# Patient Record
Sex: Female | Born: 1960 | Race: Black or African American | Hispanic: No | Marital: Single
Health system: Southern US, Community
[De-identification: ages and names within clinical notes are randomized; demographics above are authoritative.]

---

## 2004-10-04 ENCOUNTER — Ambulatory Visit: Payer: Self-pay | Admitting: Internal Medicine

## 2006-04-21 ENCOUNTER — Ambulatory Visit (HOSPITAL_COMMUNITY): Admission: RE | Admit: 2006-04-21 | Discharge: 2006-04-21 | Payer: Self-pay | Admitting: Obstetrics & Gynecology

## 2007-11-21 ENCOUNTER — Encounter (INDEPENDENT_AMBULATORY_CARE_PROVIDER_SITE_OTHER): Payer: Self-pay | Admitting: Internal Medicine

## 2007-12-03 ENCOUNTER — Ambulatory Visit (HOSPITAL_COMMUNITY): Admission: RE | Admit: 2007-12-03 | Discharge: 2007-12-03 | Payer: Self-pay | Admitting: Obstetrics & Gynecology

## 2007-12-06 DIAGNOSIS — I10 Essential (primary) hypertension: Secondary | ICD-10-CM

## 2008-10-15 ENCOUNTER — Ambulatory Visit: Payer: Self-pay | Admitting: Nurse Practitioner

## 2008-10-15 DIAGNOSIS — E7800 Pure hypercholesterolemia, unspecified: Secondary | ICD-10-CM

## 2008-10-15 DIAGNOSIS — M6740 Ganglion, unspecified site: Secondary | ICD-10-CM | POA: Insufficient documentation

## 2008-10-15 DIAGNOSIS — F17200 Nicotine dependence, unspecified, uncomplicated: Secondary | ICD-10-CM

## 2008-11-21 ENCOUNTER — Encounter (INDEPENDENT_AMBULATORY_CARE_PROVIDER_SITE_OTHER): Payer: Self-pay | Admitting: Nurse Practitioner

## 2008-11-21 ENCOUNTER — Ambulatory Visit: Payer: Self-pay | Admitting: Nurse Practitioner

## 2008-11-21 LAB — CONVERTED CEMR LAB

## 2008-11-26 ENCOUNTER — Encounter (INDEPENDENT_AMBULATORY_CARE_PROVIDER_SITE_OTHER): Payer: Self-pay | Admitting: Nurse Practitioner

## 2008-12-08 ENCOUNTER — Ambulatory Visit (HOSPITAL_COMMUNITY): Admission: RE | Admit: 2008-12-08 | Discharge: 2008-12-08 | Payer: Self-pay | Admitting: Internal Medicine

## 2009-02-19 ENCOUNTER — Encounter (INDEPENDENT_AMBULATORY_CARE_PROVIDER_SITE_OTHER): Payer: Self-pay | Admitting: Nurse Practitioner

## 2009-02-20 ENCOUNTER — Ambulatory Visit: Payer: Self-pay | Admitting: Nurse Practitioner

## 2009-02-26 ENCOUNTER — Ambulatory Visit: Payer: Self-pay | Admitting: Nurse Practitioner

## 2009-02-27 ENCOUNTER — Encounter (INDEPENDENT_AMBULATORY_CARE_PROVIDER_SITE_OTHER): Payer: Self-pay | Admitting: Nurse Practitioner

## 2009-02-27 LAB — CONVERTED CEMR LAB
AST: 28 units/L (ref 0–37)
Albumin: 4.5 g/dL (ref 3.5–5.2)
Alkaline Phosphatase: 83 units/L (ref 39–117)
Bilirubin, Direct: 0.1 mg/dL (ref 0.0–0.3)
Cholesterol: 165 mg/dL (ref 0–200)
Indirect Bilirubin: 0.4 mg/dL (ref 0.0–0.9)

## 2009-03-12 ENCOUNTER — Telehealth (INDEPENDENT_AMBULATORY_CARE_PROVIDER_SITE_OTHER): Payer: Self-pay | Admitting: Nurse Practitioner

## 2009-06-05 ENCOUNTER — Ambulatory Visit: Payer: Self-pay | Admitting: Nurse Practitioner

## 2009-06-10 ENCOUNTER — Encounter (INDEPENDENT_AMBULATORY_CARE_PROVIDER_SITE_OTHER): Payer: Self-pay | Admitting: Nurse Practitioner

## 2009-06-18 ENCOUNTER — Telehealth (INDEPENDENT_AMBULATORY_CARE_PROVIDER_SITE_OTHER): Payer: Self-pay | Admitting: Nurse Practitioner

## 2009-07-13 ENCOUNTER — Telehealth (INDEPENDENT_AMBULATORY_CARE_PROVIDER_SITE_OTHER): Payer: Self-pay | Admitting: Nurse Practitioner

## 2009-08-06 ENCOUNTER — Ambulatory Visit: Payer: Self-pay | Admitting: Nurse Practitioner

## 2009-08-06 DIAGNOSIS — M25569 Pain in unspecified knee: Secondary | ICD-10-CM | POA: Insufficient documentation

## 2009-08-06 LAB — CONVERTED CEMR LAB
Glucose, Urine, Semiquant: NEGATIVE
Microalb, Ur: 0.5 mg/dL (ref 0.00–1.89)
Nitrite: NEGATIVE
Specific Gravity, Urine: 1.025
WBC Urine, dipstick: NEGATIVE
pH: 5

## 2009-12-24 ENCOUNTER — Ambulatory Visit: Payer: Self-pay | Admitting: Nurse Practitioner

## 2009-12-24 DIAGNOSIS — J309 Allergic rhinitis, unspecified: Secondary | ICD-10-CM | POA: Insufficient documentation

## 2009-12-28 ENCOUNTER — Encounter (INDEPENDENT_AMBULATORY_CARE_PROVIDER_SITE_OTHER): Payer: Self-pay | Admitting: Nurse Practitioner

## 2009-12-29 ENCOUNTER — Ambulatory Visit (HOSPITAL_COMMUNITY): Admission: RE | Admit: 2009-12-29 | Discharge: 2009-12-29 | Payer: Self-pay | Admitting: Nurse Practitioner

## 2010-01-07 ENCOUNTER — Encounter (INDEPENDENT_AMBULATORY_CARE_PROVIDER_SITE_OTHER): Payer: Self-pay | Admitting: Nurse Practitioner

## 2010-01-14 ENCOUNTER — Telehealth (INDEPENDENT_AMBULATORY_CARE_PROVIDER_SITE_OTHER): Payer: Self-pay | Admitting: Nurse Practitioner

## 2010-06-17 ENCOUNTER — Ambulatory Visit: Payer: Self-pay | Admitting: Nurse Practitioner

## 2010-06-17 LAB — CONVERTED CEMR LAB: Sed Rate: 11 mm/hr (ref 0–22)

## 2010-08-31 ENCOUNTER — Telehealth (INDEPENDENT_AMBULATORY_CARE_PROVIDER_SITE_OTHER): Payer: Self-pay | Admitting: Nurse Practitioner

## 2010-11-10 ENCOUNTER — Telehealth (INDEPENDENT_AMBULATORY_CARE_PROVIDER_SITE_OTHER): Payer: Self-pay | Admitting: Nurse Practitioner

## 2011-01-02 LAB — CONVERTED CEMR LAB
ALT: 27 units/L (ref 0–35)
AST: 25 units/L (ref 0–37)
AST: 26 units/L (ref 0–37)
Albumin: 4.8 g/dL (ref 3.5–5.2)
Alkaline Phosphatase: 81 units/L (ref 39–117)
BUN: 11 mg/dL (ref 6–23)
Basophils Absolute: 0 10*3/uL (ref 0.0–0.1)
Basophils Relative: 1 % (ref 0–1)
Basophils Relative: 2 % — ABNORMAL HIGH (ref 0–1)
CO2: 24 meq/L (ref 19–32)
Calcium: 10.1 mg/dL (ref 8.4–10.5)
Calcium: 9.3 mg/dL (ref 8.4–10.5)
Chlamydia, DNA Probe: NEGATIVE
Chlamydia, DNA Probe: NEGATIVE
Chloride: 102 meq/L (ref 96–112)
Cholesterol, target level: 200 mg/dL
Cholesterol: 215 mg/dL — ABNORMAL HIGH (ref 0–200)
Creatinine, Ser: 0.67 mg/dL (ref 0.40–1.20)
GC Probe Amp, Genital: NEGATIVE
Glucose, Bld: 94 mg/dL (ref 70–99)
Glucose, Urine, Semiquant: NEGATIVE
Glucose, Urine, Semiquant: NEGATIVE
HCT: 40.8 % (ref 36.0–46.0)
HDL: 47 mg/dL (ref >39)
Hemoglobin: 12.4 g/dL (ref 12.0–15.0)
Hemoglobin: 13.5 g/dL (ref 12.0–15.0)
KOH Prep: NEGATIVE
KOH Prep: NEGATIVE
Ketones, urine, test strip: NEGATIVE
LDL Cholesterol: 92 mg/dL (ref 0–99)
LDL Goal: 130 mg/dL
Lymphocytes Relative: 43 % (ref 12–46)
Lymphs Abs: 2.2 10*3/uL (ref 0.7–4.0)
Lymphs Abs: 2.4 10*3/uL (ref 0.7–4.0)
MCHC: 31.2 g/dL (ref 30.0–36.0)
Monocytes Absolute: 0.5 10*3/uL (ref 0.1–1.0)
Monocytes Absolute: 0.6 10*3/uL (ref 0.1–1.0)
Monocytes Relative: 11 % (ref 3–12)
Neutro Abs: 1 10*3/uL — ABNORMAL LOW (ref 1.7–7.7)
Neutro Abs: 2.3 10*3/uL (ref 1.7–7.7)
Nitrite: NEGATIVE
OCCULT 1: NEGATIVE
Potassium: 4.7 meq/L (ref 3.5–5.3)
RBC: 4.6 M/uL (ref 3.87–5.11)
RBC: 4.79 M/uL (ref 3.87–5.11)
RDW: 14.3 % (ref 11.5–15.5)
Sodium: 141 meq/L (ref 135–145)
Specific Gravity, Urine: 1.015
TSH: 1.333 microintl units/mL (ref 0.350–4.50)
TSH: 1.415 microintl units/mL (ref 0.350–4.500)
Total CHOL/HDL Ratio: 3.5
Triglycerides: 120 mg/dL (ref <150)
Urobilinogen, UA: 0.2
WBC: 4.1 10*3/uL (ref 4.0–10.5)
WBC: 5.6 10*3/uL (ref 4.0–10.5)
pH: 6

## 2011-01-04 NOTE — Progress Notes (Signed)
Summary: Query:  Refill loratidine?  Phone Note Outgoing Call   Summary of Call: Do you want her loratidine refilled?  Rx 06/2009 x3 refills.   Initial call taken by: Dutch Quint RN,  August 31, 2010 9:48 AM  Follow-up for Phone Call        yes, ok to refill Follow-up by: Lehman Prom FNP,  August 31, 2010 10:14 AM  Additional Follow-up for Phone Call Additional follow up Details #1::        Rx refilled.  Dutch Quint RN  August 31, 2010 10:19 AM

## 2011-01-04 NOTE — Letter (Signed)
Summary: *HSN Results Follow up  [facility]  [address] [place], [place] [address]   Phone: [phone]  Fax: [phone]      12/28/2009   Sandy Perry [address] [place], [place]  [address]   Dear  Ms. Sandy Perry,                            ____S.Drinkard,FNP   ____D. Gore,FNP       ____B. McPherson,MD   ____V. Rankins,MD    ____E. Mulberry,MD    _X___N. Daphine Deutscher, FNP  ____D. Reche Dixon, MD    ____K. Philipp Deputy, MD    ____Other     This letter is to inform you that your recent test(s):  ___X____Pap Smear    ___X____Lab Test     _______X-ray    ____X___ is within acceptable limits  _______ requires a medication change  _______ requires a follow-up lab visit  _______ requires a follow-up visit with your provider   Comments: Labs done during recent office visit are ok.  Pap Smear is ________________________________.       _________________________________________________________ If you have any questions, please contact our office [phone].                    Sincerely,    Sandy Prom FNP [facility]

## 2011-01-04 NOTE — Letter (Signed)
Summary: *HSN Results Follow up  [facility]  [address] [place], [place] [address]   Phone: [phone]  Fax: [phone]      01/07/2010   [name] [address] [place], [place]  [address]   Dear  Ms. Sandy Perry,                            ____S.Drinkard,FNP   ____D. Gore,FNP       ____B. McPherson,MD   ____V. Rankins,MD    ____E. Mulberry,MD    _X___N. Daphine Deutscher, FNP  ____D. Reche Dixon, MD    ____K. Philipp Deputy, MD    ____Other     This letter is to inform you that your recent test(s):  Stool Cards  ____X___ is within acceptable limits  _______ requires a medication change  _______ requires a follow-up lab visit  _______ requires a follow-up visit with your provider   Comments:  Stool cards are normal.       _________________________________________________________ If you have any questions, please contact our office [phone].                    Sincerely,    Sandy Prom FNP [facility]

## 2011-01-04 NOTE — Progress Notes (Signed)
Summary: Office Visit/DEPRESSION SCREENING  Office Visit/DEPRESSION SCREENING   Imported By: Arta Bruce 01/29/2010 14:52:01  _____________________________________________________________________  External Attachment:    Type:   Image     Comment:   External Document

## 2011-01-04 NOTE — Assessment & Plan Note (Signed)
Summary: Left leg pain   Vital Signs:  Patient profile:   50 year old female Menstrual status:  postmenopausal Weight:      167.6 pounds BMI:     29.80 BSA:     1.80 Temp:     98.0 degrees F oral Pulse rate:   69 / minute Pulse rhythm:   regular Resp:     16 per minute BP sitting:   112 / 81  (left arm) Cuff size:   regular  Vitals Entered By: Levon Hedger (June 17, 2010 8:59 AM) CC: knee pain with swelling x 1 month, Hypertension Management, Lipid Management Is Patient Diabetic? No Pain Assessment Patient in pain? yes     Location: knee  Does patient need assistance? Functional Status Self care Ambulation Normal   CC:  knee pain with swelling x 1 month, Hypertension Management, and Lipid Management.  History of Present Illness:  Pt into the office with complaints of left leg pain Reports that at least 2 weeks ago she woke with extreme swelling in her left leg. Painful to walk, pt admitted that she had to hop when walking. 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  Hypertension History:      She denies headache, chest pain, and palpitations.  She notes no problems with any antihypertensive medication side effects.        Positive major cardiovascular risk factors include hyperlipidemia and hypertension.  Negative major cardiovascular risk factors include female age less than 43 years old, no history of diabetes, negative family history for ischemic heart disease, and non-tobacco-user status.        Further assessment for target organ damage reveals no history of ASHD, cardiac end-organ  damage (CHF/LVH), stroke/TIA, peripheral vascular disease, renal insufficiency, or hypertensive retinopathy.    Lipid Management History:      Positive NCEP/ATP III risk factors include hypertension.  Negative NCEP/ATP III risk factors include female age less than 33 years old, non-diabetic, no family history for ischemic heart disease, non-tobacco-user status, no ASHD (atherosclerotic heart disease), no prior stroke/TIA, no peripheral vascular disease, and no history of aortic aneurysm.        The patient states that she knows about the "Therapeutic Lifestyle Change" diet.  Her compliance with the TLC diet is fair.  The patient does not know about adjunctive measures for cholesterol lowering.  She expresses no side effects from her lipid-lowering medication.  Comments include: pt is taking meds as ordered.  The patient denies any symptoms to suggest myopathy or liver disease.      Medications Prior to Update: 1)  Hydrochlorothiazide 25 Mg Tabs (Hydrochlorothiazide) .... Take One (1) By Mouth Daily For Blood Pressure 2)  Pravastatin Sodium 20 Mg Tabs (Pravastatin Sodium) .Marland Kitchen.. 1 Tablet By Mouth Nightly For Cholesterol 3)  Loratadine 10 Mg Tabs (Loratadine) .Marland Kitchen.. 1 Tablet By Mouth Daily For Allergies 4)  Fluticasone Propionate 50 Mcg/act Susp (Fluticasone Propionate) .Marland Kitchen.. 1 Puff in Each Nostril Two Times A Day  **hold Head Down**  Allergies (verified): No Known Drug Allergies  Review of Systems CV:  Complains  of leg cramps with exertion; denies chest pain or discomfort and shortness of breath with exertion. Resp:  Denies cough. GI:  Denies abdominal pain, nausea, and vomiting. MS:  Complains of joint pain and muscle; left leg pain.  Physical Exam  General:  alert.   Head:  normocephalic.   Eyes:  glasses Lungs:  normal breath sounds.   Heart:  normal rate and regular rhythm.   Msk:  up to the exam table Pulses:  R dorsalis pedis normal and L dorsalis pedis normal.   Neurologic:  alert &  oriented X3.   limping gait   Impression & Recommendations:  Problem # 1:  PAIN IN JOINT, LOWER LEG (ICD-719.46) Will check labs today  if elevated will need ultrasound for left leg to check for DVT if negative pt instructed to get knee support and start anti-inflammatory Her updated medication list for this problem includes:    Diclofenac Sodium 75 Mg Tbec (Diclofenac sodium) ..... One tablet by mouth two times a day as needed for inflammation  Orders: T-Sed Rate (Automated) (04540-98119) T-D-Dimer Fibrin Derivatives Quantitive [phone])  Problem # 2:  HYPERTENSION, BENIGN ESSENTIAL (ICD-401.1) BP stable continue current meds Her updated medication list for this problem includes:    Hydrochlorothiazide 25 Mg Tabs (Hydrochlorothiazide) .Marland Kitchen... Take one (1) by mouth daily for blood pressure  Problem # 3:  HYPERCHOLESTEROLEMIA (ICD-272.0)  Her updated medication list for this problem includes:    Pravastatin Sodium 20 Mg Tabs (Pravastatin sodium) .Marland Kitchen... 1 tablet by mouth nightly for cholesterol  Complete Medication List: 1)  Hydrochlorothiazide 25 Mg Tabs (Hydrochlorothiazide) .... Take one (1) by mouth daily for blood pressure 2)  Pravastatin Sodium 20 Mg Tabs (Pravastatin sodium) .Marland Kitchen.. 1 tablet by mouth nightly for cholesterol 3)  Loratadine 10 Mg Tabs (Loratadine) .Marland Kitchen.. 1 tablet by mouth daily for allergies 4)  Fluticasone Propionate 50 Mcg/act Susp (Fluticasone propionate) .Marland Kitchen.. 1 puff in each nostril two times a day  **hold head down** 5)  Diclofenac Sodium 75 Mg Tbec (Diclofenac sodium) .... One tablet by mouth two times a day as needed for inflammation  Hypertension Assessment/Plan:      The patient's hypertensive risk group is category B: At least one risk factor (excluding diabetes) with no target organ damage.  Her calculated 10 year risk of coronary heart disease is 6 %.  Today's blood pressure is 112/81.  Her blood pressure goal is < 140/90.  Lipid Assessment/Plan:       Based on NCEP/ATP III, the patient's risk factor category is "0-1 risk factors".  The patient's lipid goals are as follows: Total cholesterol goal is 200; LDL cholesterol goal is 130; HDL cholesterol goal is 40; Triglyceride goal is 150.    Patient Instructions: 1)  Your labs will be sent to the lab and results will be returned today. 2)  If labs are ok, then you will need to get a left knee support and start diclofenac 75mg  by mouth two times a day (with food) for at least 7 days then as needed  3)  If labs are positive then you will need to get an ultrasoud of the lower extremities to r/o blood clot Prescriptions: DICLOFENAC SODIUM 75 MG TBEC (DICLOFENAC SODIUM) One tablet by mouth two times a day as needed for inflammation  #60 x 0   Entered and Authorized by:   Lehman Prom FNP   Signed by:   Lehman Prom FNP on 06/17/2010   Method used:   Print then Give  to Patient   RxID:   [id number]

## 2011-01-04 NOTE — Assessment & Plan Note (Signed)
Summary: Complete Physical Exam   Vital Signs:  Patient profile:   50 year old female Menstrual status:  postmenopausal Weight:      171 pounds Temp:     98.2 degrees F Pulse rate:   67 / minute Pulse rhythm:   regular Resp:     20 per minute BP sitting:   121 / 83  (left arm) Cuff size:   regular  Vitals Entered By: Vesta Mixer CMA (December 24, 2009 8:42 AM) CC: CPP also has cold or something, Hypertension Management Is Patient Diabetic? No Pain Assessment Patient in pain? no       Does patient need assistance? Ambulation Normal Menarche (age onset years): 8    Menstrual Status postmenopausal Last PAP Result  Specimen Adequacy: Satisfactory for evaluation.   Interpretation/Result:Negative for intraepithelial Lesion or Malignancy.      CC:  CPP also has cold or something and Hypertension Management.  History of Present Illness:  Pt into the office for a complete physical exam.  PAP - All previous PAP Smears have been normal. no family hx of ovarian cancer Menses - usually has spotting about 1 time per year that has been going on since at least age 65 Relationship: has a boyfriend.  s/p tubal ligation  Mammogram - done last year. No family hx of breast cancer  Tdap - up to date  Optho - last visit 2007. wears glasses  Dental - last visit in 08/2008  Colonscopy - never had a colonscopy.  no family history of colon cancer no BRBPR normal bowel movements. no constipation  Pt is fasting today for labs   Hypertension History:      She denies headache, chest pain, and palpitations.  She notes no problems with any antihypertensive medication side effects.  taking meds as ordered.        Positive major cardiovascular risk factors include hyperlipidemia and hypertension.  Negative major cardiovascular risk factors include female age less than 7 years old, negative family history for ischemic heart disease, and non-tobacco-user status.        Further assessment  for target organ damage reveals no history of ASHD, cardiac end-organ damage (CHF/LVH), stroke/TIA, peripheral vascular disease, renal insufficiency, or hypertensive retinopathy.     Habits & Providers  Alcohol-Tobacco-Diet     Alcohol type: beer     Tobacco Status: quit     Tobacco Counseling: to remain off tobacco products     Cigarette Packs/Day: 1/2     Year Started: age 27  Exercise-Depression-Behavior     Does Patient Exercise: yes     Type of exercise: walking     Have you felt down or hopeless? no     Have you felt little pleasure in things? no     Depression Counseling: not indicated; screening negative for depression     Drug Use: never     Seat Belt Use: 100     Sun Exposure: occasionally  Comments: PHQ-9 score = 6  Allergies: No Known Drug Allergies  Review of Systems General:  Denies fever. Eyes:  Denies blurring. ENT:  Complains of nasal congestion; denies earache. CV:  Denies chest pain or discomfort. Resp:  Denies cough. GI:  Denies abdominal pain. GU:  Denies discharge. MS:  Denies joint pain. Derm:  Denies rash. Neuro:  Denies headaches. Psych:  Denies anxiety and depression.  Physical Exam  General:  alert.   Head:  normocephalic.   Eyes:  vision grossly intact, pupils equal, and  pupils round.  glasses Ears:  Bil TM with clear fluid, no erythema Nose:  inflammed turbinates Mouth:  pharynx pink and moist and poor dentition.   Neck:  supple.   Chest Wall:  no mass.   Breasts:  right breast - lump noted at 6 o'clock left breast - no tenderness Lungs:  normal breath sounds.   Heart:  normal rate and regular rhythm.   Abdomen:  soft, non-tender, and normal bowel sounds.   Rectal:  no external abnormalities.   Msk:  normal ROM.   Pulses:  R radial normal, R dorsalis pedis normal, L radial normal, and L dorsalis pedis normal.   Extremities:  no edema Neurologic:  alert & oriented X3, cranial nerves II-XII intact, and gait normal.   Skin:  color  normal.   Psych:  Oriented X3.    Pelvic Exam  Vulva:      normal appearance.   Urethra and Bladder:      Urethra--no discharge.   Vagina:      physiologic discharge, post-menopausal.   Cervix:      midposition.   Adnexa:      nontender bilaterally.   Rectum:      normal, heme negative stool.      Impression & Recommendations:  Problem # 1:  ROUTINE GYNECOLOGICAL EXAMINATION (ICD-V72.31) PAP done labs done  PHQ-9 score = 6 stool cards given x 3. colonscopy indications given to pt guaiac negative in office EKG done Orders: Hemoccult Guaiac-1 spec.(in office) ([address]70) T- GC Chlamydia ([address]) KOH/ WET Mount [phone]) Pap Smear, Thin Prep ( Collection of) (U9811) Hemoccult Cards (Take Home) (Hemoccult Cards) Rapid HIV  ([address])  Problem # 2:  OTHER SCREENING BREAST EXAMINATION (ICD-V76.19) self breast exam placcard given to pt mammogram scheduled Orders: Mammogram (Screening) (Mammo)  Problem # 3:  ALLERGIC RHINITIS (ICD-477.9) will add nasal spray Her updated medication list for this problem includes:    Loratadine 10 Mg Tabs (Loratadine) .Marland Kitchen... 1 tablet by mouth daily for allergies    Fluticasone Propionate 50 Mcg/act Susp (Fluticasone propionate) .Marland Kitchen... 1 puff in each nostril two times a day  **hold head down**  Problem # 4:  HYPERTENSION, BENIGN ESSENTIAL (ICD-401.1) DASH diet stable Her updated medication list for this problem includes:    Hydrochlorothiazide 25 Mg Tabs (Hydrochlorothiazide) .Marland Kitchen... Take one (1) by mouth daily for blood pressure  Orders: EKG w/ Interpretation ([address]000) UA Dipstick w/o Micro (manual) (91478) T-Urine Microalbumin w/creat. ratio [phone]) T-Lipid Profile [phone]) T-Comprehensive Metabolic Panel [phone]) T-CBC w/Diff ([address]64-40347) T-TSH [phone])  Problem # 5:  HYPERCHOLESTEROLEMIA (ICD-272.0) will check today Her updated medication list for this problem includes:    Pravastatin Sodium 20 Mg Tabs  (Pravastatin sodium) .Marland Kitchen... 1 tablet by mouth nightly for cholesterol  Orders: T-Lipid Profile ([id number]) T-Comprehensive Metabolic Panel ([address]-63016)  Complete Medication List: 1)  Hydrochlorothiazide 25 Mg Tabs (Hydrochlorothiazide) .... Take one (1) by mouth daily for blood pressure 2)  Pravastatin Sodium 20 Mg Tabs (Pravastatin sodium) .Marland Kitchen.. 1 tablet by mouth nightly for cholesterol 3)  Loratadine 10 Mg Tabs (Loratadine) .Marland Kitchen.. 1 tablet by mouth daily for allergies 4)  Fluticasone Propionate 50 Mcg/act Susp (Fluticasone propionate) .Marland Kitchen.. 1 puff in each nostril two times a day  **hold head down**  Hypertension Assessment/Plan:      The patient's hypertensive risk group is category B: At least one risk factor (excluding diabetes) with no target organ damage.  Her calculated 10 year risk of coronary heart disease is 5 %.  Today's  blood pressure is 121/83.  Her blood pressure goal is < 140/90.  Patient Instructions: 1)  You will be informed of any abnormal labs. 2)  Blood pressure is doing great. 3)  Bring stool cards back to this office when complete. 4)  Nasal congestion - use nasal spray twice daily for congestion (hold head down).  Keep taking the lorantadine 10mg  by mouth daily. 5)  Follow up in 6 months or sooner if needed Prescriptions: FLUTICASONE PROPIONATE 50 MCG/ACT SUSP (FLUTICASONE PROPIONATE) 1 puff in each nostril two times a day  **Hold head down**  #1 x 5   Entered and Authorized by:   Lehman Prom FNP   Signed by:   Lehman Prom FNP on 12/24/2009   Method used:   Print then Give to Patient   RxID:   [id number]   Prevention & Chronic Care Immunizations   Influenza vaccine: Not documented   Influenza vaccine deferral: Refused  (12/24/2009)   Influenza vaccine due: 08/06/2011    Tetanus booster: 11/21/2008: Tdap    Pneumococcal vaccine: Not documented  Colorectal Screening   Hemoccult: Not documented   Hemoccult action/deferral: NEG X 1 today   (11/21/2008)   Hemoccult due: 12/24/2010    Colonoscopy: Not documented   Colonoscopy action/deferral: Deferred  (12/24/2009)  Other Screening   Pap smear:  Specimen Adequacy: Satisfactory for evaluation.   Interpretation/Result:Negative for intraepithelial Lesion or Malignancy.     (11/21/2008)   Pap smear action/deferral: PAP smear done  (11/21/2008)   Pap smear due: 12/24/2010    Mammogram: ASSESSMENT: Negative - BI-RADS 1^MM DIGITAL SCREENING  (12/08/2008)   Mammogram action/deferral: mammogram ordered  (11/21/2008)   Smoking status: quit  (12/24/2009)  Lipids   Total Cholesterol: 165  (02/26/2009)   Lipid panel action/deferral: Lipid Panel ordered   LDL: 74  (02/26/2009)   LDL Direct: Not documented   HDL: 53  (02/26/2009)   Triglycerides: 189  (02/26/2009)    SGOT (AST): 28  (02/26/2009)   SGPT (ALT): 27  (02/26/2009) CMP ordered    Alkaline phosphatase: 83  (02/26/2009)   Total bilirubin: 0.5  (02/26/2009)  Hypertension   Last Blood Pressure: 121 / 83  (12/24/2009)   Serum creatinine: 0.74  (11/21/2008)   Serum potassium 4.7  (11/21/2008) CMP ordered   Self-Management Support :    Hypertension self-management support: Not documented    Lipid self-management support: Not documented     EKG  Procedure date:  12/24/2009  Findings:      normal:  63   Laboratory Results   Urine Tests  Date/Time Received: December 24, 2009 10:05 AM   Routine Urinalysis   Color: lt. yellow Appearance: Clear Glucose: negative   (Normal Range: Negative) Bilirubin: negative   (Normal Range: Negative) Ketone: negative   (Normal Range: Negative) Spec. Gravity: 1.010   (Normal Range: 1.003-1.035) Blood: negative   (Normal Range: Negative) pH: 6.5   (Normal Range: 5.0-8.0) Protein: negative   (Normal Range: Negative) Urobilinogen: 0.2   (Normal Range: 0-1) Nitrite: negative   (Normal Range: Negative) Leukocyte Esterace: small   (Normal Range: Negative)    Date/Time  Received: December 24, 2009 10:58 AM   Wet [place] Source: vaginal WBC/hpf: 1-5 Bacteria/hpf: rare Clue cells/hpf: none Yeast/hpf: none [place] KOH: Negative Trichomonas/hpf: none  Other Tests  Rapid HIV: negative  Stool - Occult Blood Hemmoccult #1: negative Date: 12/24/2009    Laboratory Results   Urine Tests    Routine Urinalysis   Color: lt. yellow Appearance:  Clear Glucose: negative   (Normal Range: Negative) Bilirubin: negative   (Normal Range: Negative) Ketone: negative   (Normal Range: Negative) Spec. Gravity: 1.010   (Normal Range: 1.003-1.035) Blood: negative   (Normal Range: Negative) pH: 6.5   (Normal Range: 5.0-8.0) Protein: negative   (Normal Range: Negative) Urobilinogen: 0.2   (Normal Range: 0-1) Nitrite: negative   (Normal Range: Negative) Leukocyte Esterace: small   (Normal Range: Negative)      [place]et [place]/KOH  Other Tests  Rapid HIV: negative   Appended Document: Hemoccult results  Laboratory Results    Stool - Occult Blood Hemmoccult #1: negative Date: 01/07/2010 Hemoccult #2: negative Date: 01/07/2010 Hemoccult #3: negative Date: 01/07/2010

## 2011-01-04 NOTE — Progress Notes (Signed)
Summary: medications  Phone Note Call from Patient Call back at [facility] Phone [phone]   Summary of Call: The pt needs more refills from pravastatin and hydrohlorthiazide medication.  Pt totally forgot to birng this to the provider in her last visit.  [facility] [address] [phone]) Daphine Deutscher FnP Initial call taken by: Manon Hilding,  January 14, 2010 10:23 AM  Follow-up for Phone Call        forward to provider... HCTZ #90 x 1 on 06/18/09 and pravastatin #30 x 3 on 06/05/09 Follow-up by: [place] Shannon,  January 15, 2010 4:38 PM  Additional Follow-up for Phone Call Additional follow up Details #1::        Rx sent electronically to [facility] advise pt to check with pharmacy to see when it will be ready Additional Follow-up by: Lehman Prom FNP,  January 17, 2010 10:37 PM    Additional Follow-up for Phone Call Additional follow up Details #2::    pt informed. Follow-up by: Levon Hedger,  January 18, 2010 2:30 PM  Prescriptions: PRAVASTATIN SODIUM 20 MG TABS (PRAVASTATIN SODIUM) 1 tablet by mouth nightly for cholesterol  #90 x 1   Entered and Authorized by:   Lehman Prom FNP   Signed by:   Lehman Prom FNP on 01/17/2010   Method used:   Electronically to        [facility] [phone]* (retail)       [address]       [place], [place]  [address]       Ph: [phone]       Fax: [phone]   RxID:   [id number] HYDROCHLOROTHIAZIDE 25 MG TABS (HYDROCHLOROTHIAZIDE) Take one (1) by mouth daily for blood pressure  #90 x 1   Entered and Authorized by:   Lehman Prom FNP   Signed by:   Lehman Prom FNP on 01/17/2010   Method used:   Electronically to        [facility] [phone]* (retail)       [address]       [place], [place]  [address]       Ph: [phone]       Fax: [phone]   RxID:   [id number]

## 2011-01-04 NOTE — Letter (Signed)
Summary: TEST ORDER FORM//MAMMOGRAM//APPT DATE & TIME  TEST ORDER FORM//MAMMOGRAM//APPT DATE & TIME   Imported By: Arta Bruce 02/01/2010 15:15:22  _____________________________________________________________________  External Attachment:    Type:   Image     Comment:   External Document

## 2011-01-04 NOTE — Progress Notes (Signed)
Summary: Query:  Refill pravastatin and HCTZ?  Phone Note Outgoing Call   Summary of Call: Refill pravastatin and HCTZ?  Last seen 06/17/10. Initial call taken by: Dutch Quint RN,  November 10, 2010 5:43 PM  Follow-up for Phone Call        Meds refilled Follow-up by: Lehman Prom FNP,  November 10, 2010 5:54 PM

## 2011-02-08 ENCOUNTER — Telehealth (INDEPENDENT_AMBULATORY_CARE_PROVIDER_SITE_OTHER): Payer: Self-pay | Admitting: Nurse Practitioner

## 2011-02-15 NOTE — Progress Notes (Signed)
Summary: Query:  Refill pravastatin and HCTZ?  Phone Note Outgoing Call   Summary of Call: Last seen 06/2010.  Refill pravastatin and HCTZ per protocol?  Has eligibility recert 3/28.   Initial call taken by: Dutch Quint RN,  February 08, 2011 5:46 PM  Follow-up for Phone Call        will for 30 days plus 1 refill until pt's visit. advise her to come fasting to her visit because she needs labs Follow-up by: Lehman Prom FNP,  February 09, 2011 8:34 AM  Additional Follow-up for Phone Call Additional follow up Details #1::        Noted.  Refills completed.  Pt. notified of refills and appt. scheduled for 03/03/11 for f/u with provider - advised re fasting for OV.  Verbalized understanding and agreement.  Dutch Quint RN  February 11, 2011 3:14 PM

## 2013-06-04 ENCOUNTER — Ambulatory Visit
Admission: RE | Admit: 2013-06-04 | Discharge: 2013-06-04 | Disposition: A | Discharge status: Home / Self Care | Payer: BC Managed Care – PPO | Source: Ambulatory Visit | Attending: Nurse Practitioner | Admitting: Nurse Practitioner

## 2013-06-04 ENCOUNTER — Other Ambulatory Visit: Payer: Self-pay | Admitting: Nurse Practitioner

## 2013-06-04 DIAGNOSIS — R52 Pain, unspecified: Secondary | ICD-10-CM

## 2013-09-03 ENCOUNTER — Other Ambulatory Visit (HOSPITAL_COMMUNITY): Payer: Self-pay | Admitting: Nurse Practitioner

## 2013-09-03 DIAGNOSIS — Z1231 Encounter for screening mammogram for malignant neoplasm of breast: Secondary | ICD-10-CM

## 2013-09-10 ENCOUNTER — Ambulatory Visit (HOSPITAL_COMMUNITY)
Admission: RE | Admit: 2013-09-10 | Discharge: 2013-09-10 | Disposition: A | Discharge status: Home / Self Care | Payer: BC Managed Care – PPO | Source: Ambulatory Visit | Attending: Nurse Practitioner | Admitting: Nurse Practitioner

## 2013-09-10 DIAGNOSIS — Z1231 Encounter for screening mammogram for malignant neoplasm of breast: Secondary | ICD-10-CM

## 2015-01-27 ENCOUNTER — Other Ambulatory Visit (HOSPITAL_COMMUNITY): Payer: Self-pay | Admitting: Family

## 2015-01-27 DIAGNOSIS — Z1231 Encounter for screening mammogram for malignant neoplasm of breast: Secondary | ICD-10-CM

## 2015-02-02 ENCOUNTER — Ambulatory Visit (HOSPITAL_COMMUNITY)
Admission: RE | Admit: 2015-02-02 | Discharge: 2015-02-02 | Disposition: A | Discharge status: Home / Self Care | Payer: 59 | Source: Ambulatory Visit | Attending: Family | Admitting: Family

## 2015-02-02 DIAGNOSIS — Z1231 Encounter for screening mammogram for malignant neoplasm of breast: Secondary | ICD-10-CM | POA: Diagnosis present

## 2015-11-19 ENCOUNTER — Other Ambulatory Visit: Payer: Self-pay | Admitting: Nurse Practitioner

## 2015-11-19 DIAGNOSIS — M79621 Pain in right upper arm: Secondary | ICD-10-CM

## 2015-11-19 DIAGNOSIS — N6310 Unspecified lump in the right breast, unspecified quadrant: Secondary | ICD-10-CM

## 2015-11-25 ENCOUNTER — Ambulatory Visit
Admission: RE | Admit: 2015-11-25 | Discharge: 2015-11-25 | Disposition: A | Discharge status: Home / Self Care | Payer: 59 | Source: Ambulatory Visit | Attending: Internal Medicine | Admitting: Internal Medicine

## 2015-11-25 DIAGNOSIS — N6310 Unspecified lump in the right breast, unspecified quadrant: Secondary | ICD-10-CM

## 2015-11-25 DIAGNOSIS — M79621 Pain in right upper arm: Secondary | ICD-10-CM

## 2016-01-10 IMAGING — MG MM DIAG BREAST TOMO UNI RIGHT
8 series · 8 of 24 positions shown · non-contrast
Comparison: Previous exam(s).

CLINICAL DATA: 54-year-old female presenting for evaluation of
stabbing intermittent right breast pain. Her physician palpates a
mass in the upper-outer right breast at 10-11 o'clock.

EXAM:
DIGITAL DIAGNOSTIC RIGHT MAMMOGRAM WITH 3D TOMOSYNTHESIS AND CAD
RIGHT BREAST ULTRASOUND

[R MLO (1 of 2)]
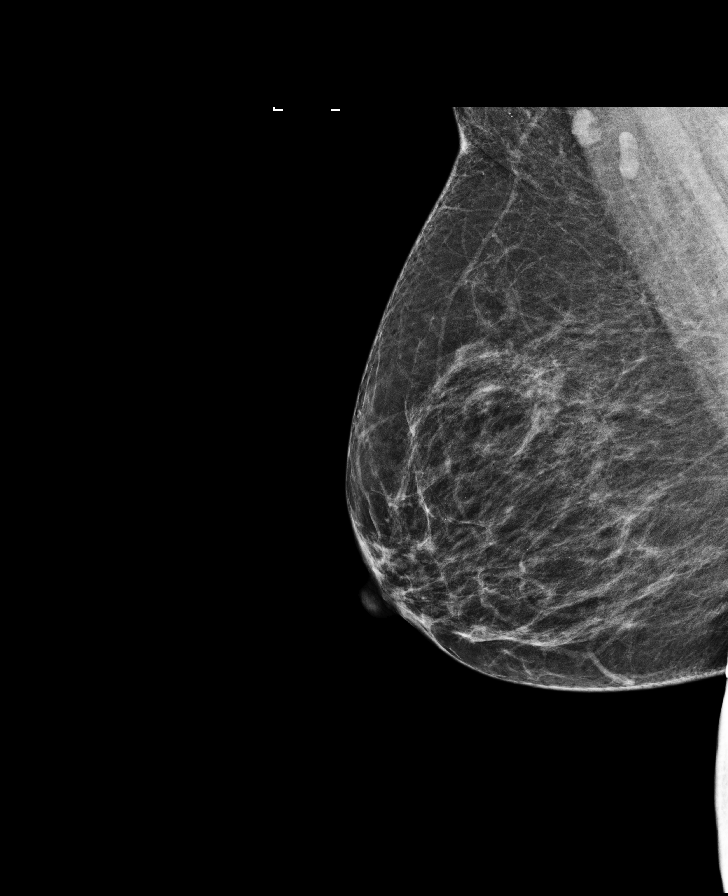

[R MLO (2 of 2)]
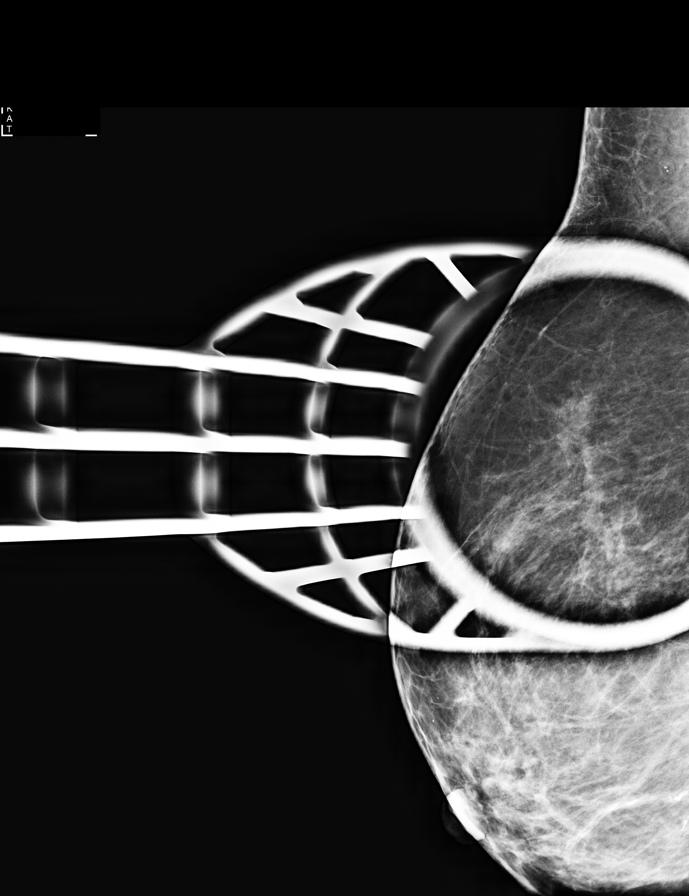

[R CC (1 of 2)]
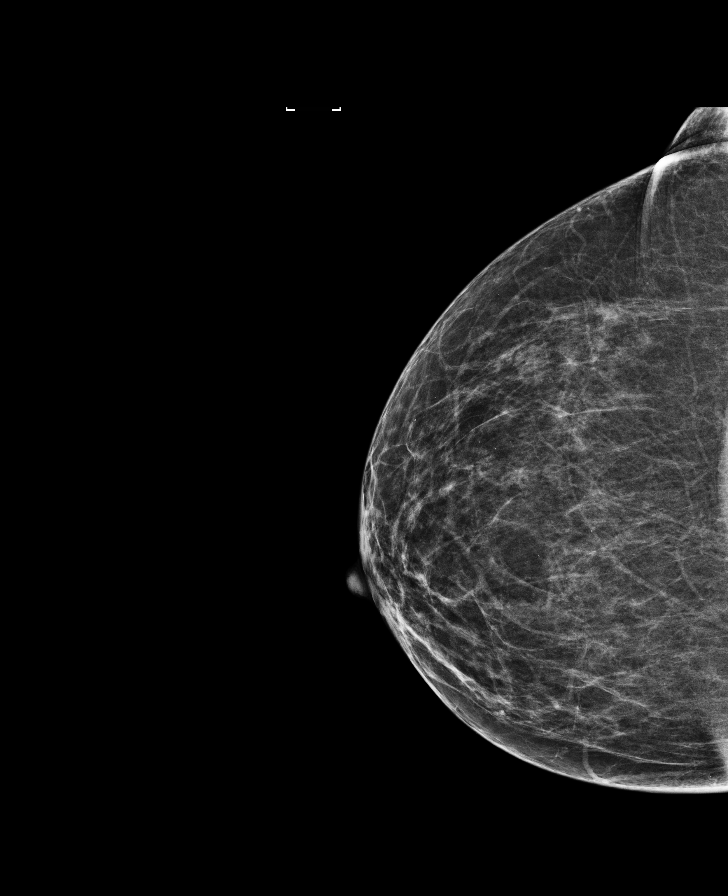

[R CC (2 of 2)]
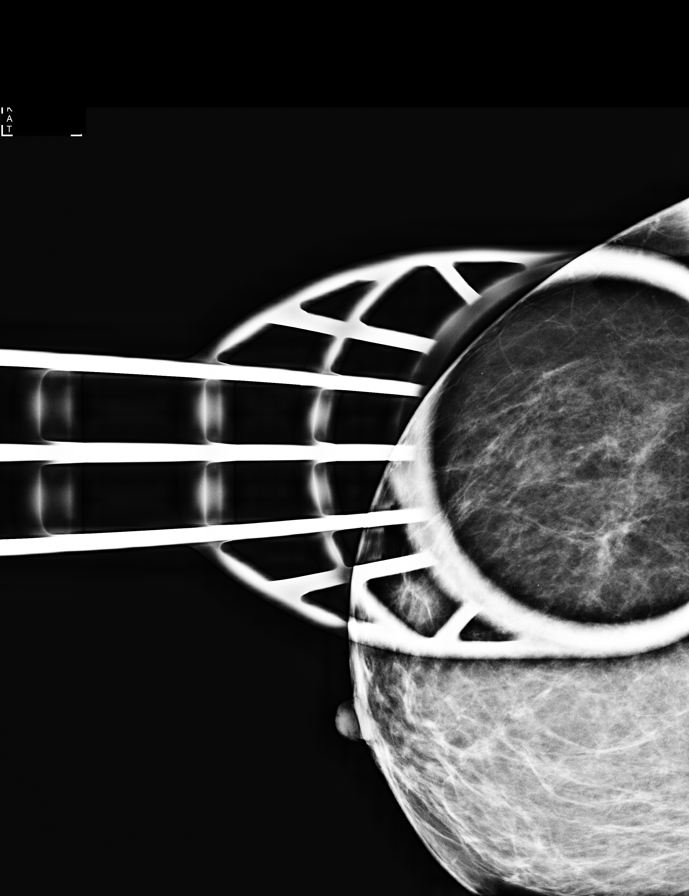

[R MLO tomo (1 of 2) · tomo slice 23/46.0]
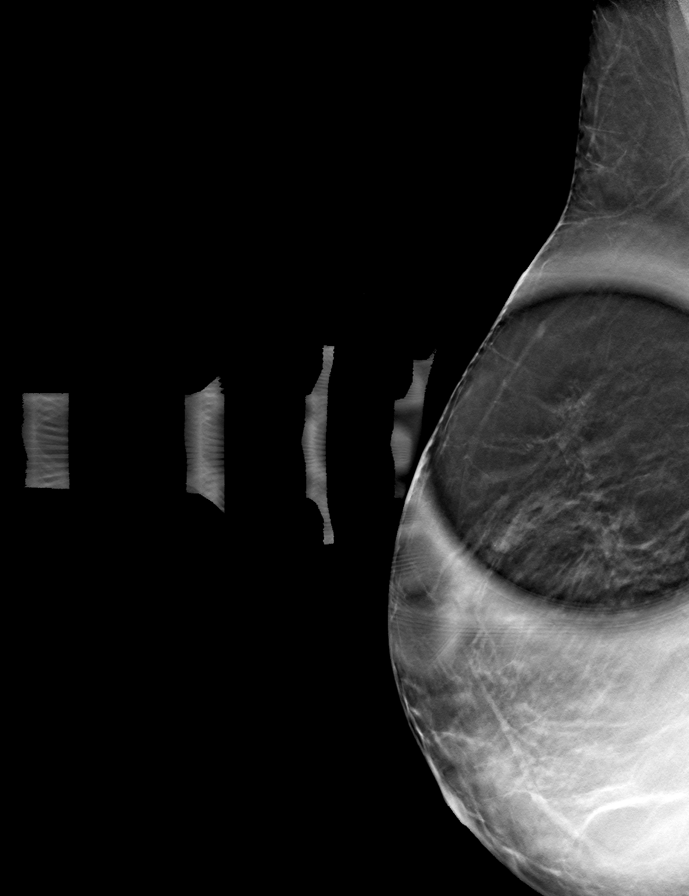

[R CC tomo (1 of 2) · tomo slice 35/69.0]
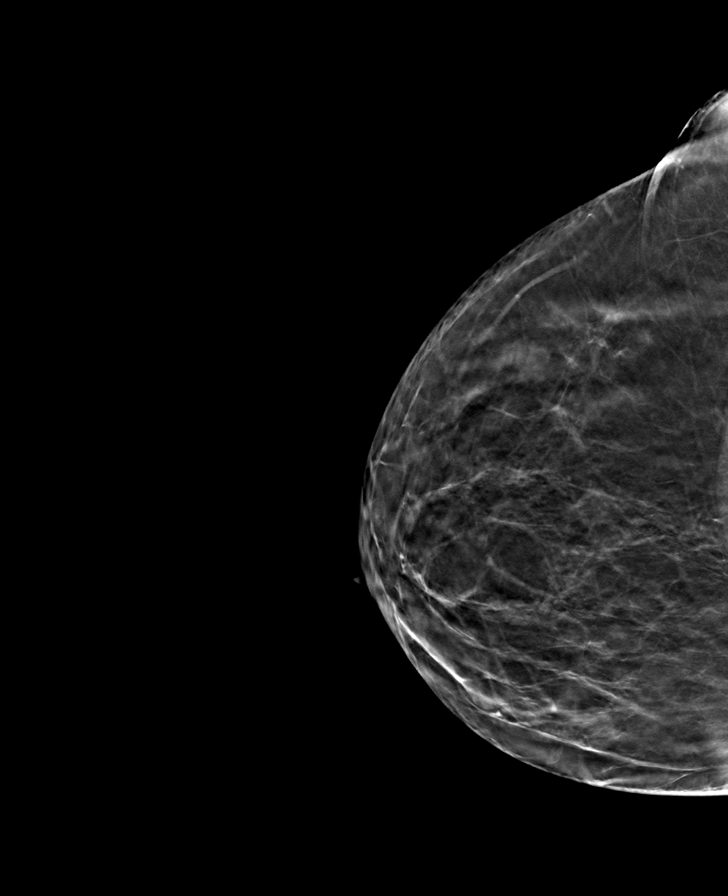

[R CC tomo (2 of 2) · tomo slice 22/43.0]
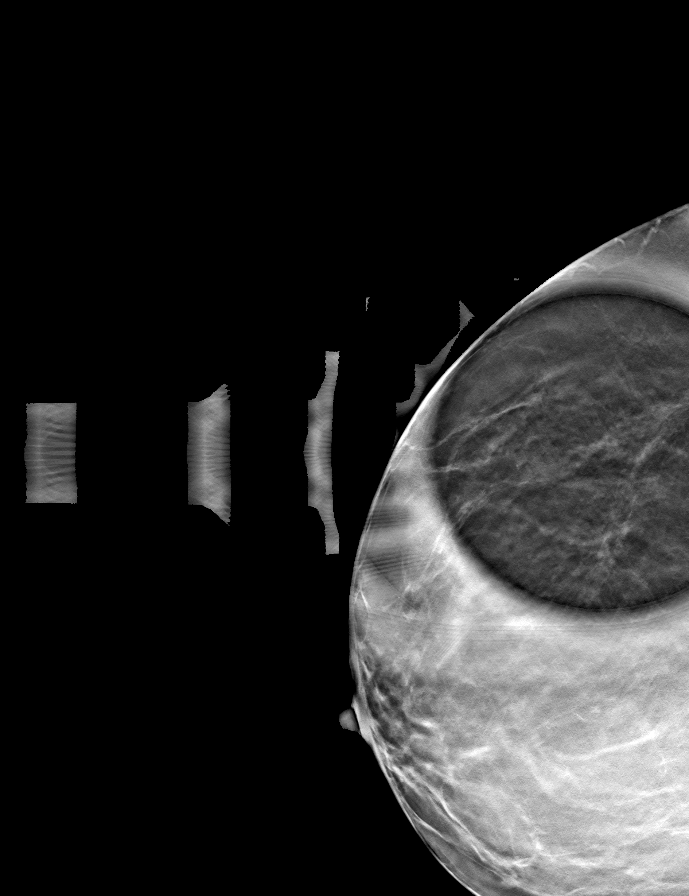

[R MLO tomo (2 of 2) · tomo slice 36/71.0]
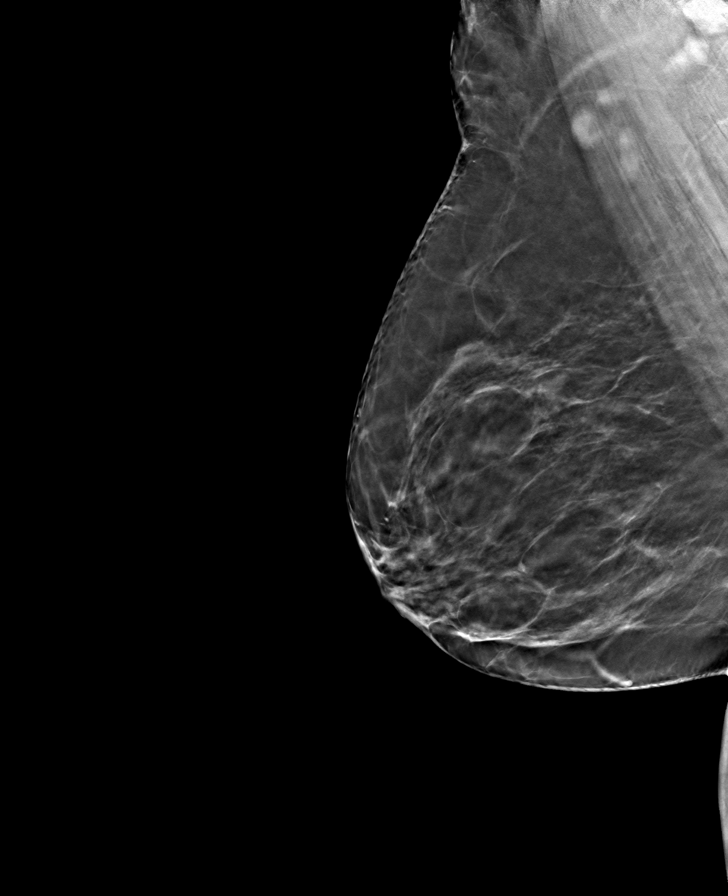

[8 of 24 positions shown; findings below may reference images not displayed]

ACR Breast Density Category b: There are scattered areas of
fibroglandular density.
FINDINGS: No suspicious calcifications, masses or areas of distortion are seen
in the bilateral breasts. Spot compression tomosynthesis images of
the upper-outer right breast demonstrates a very subtle ill-defined
mass with interspersed fat. This area appears not significantly
changed from prior mammograms, and can be seen as stable back to at
least 5434.

Mammographic images were processed with CAD.

Physical exam of the upper-outer quadrant of the right breast
demonstrates a soft mobile lump at approximately 10-11 o'clock. No
significant tenderness is elicited with palpation.

Ultrasound of the upper-outer quadrant, which was imaged from the 9
o'clock to the 12 o'clock location demonstrates normal
fibroglandular tissue. Ultrasound of the palpable area in the
upper-outer quadrant of the right breast demonstrates normal
fibroglandular tissue. No mass or suspicious area of shadowing is
identified.
IMPRESSION: 1. No mammographic or targeted sonographic correlate for the tender
area in the upper-outer right breast for for the palpable lump in
the upper-outer quadrant.

2. There is a stable subtle benign mass in the upper-outer right
breast with interspersed fat on mammography. This may represent a
hamartoma, but regardless has been stable since at least 5434.

3.  No mammographic evidence of malignancy in the right breast.

RECOMMENDATION:
1. Clinical follow-up is recommended for the palpable lump and the
tender area in the upper outer right breast without mammographic or
sonographic correlate. Any further workup should be based on
clinical grounds.

2. Return to screening mammography is recommended. The patient will
be due for bilateral screening mammography in Wednesday January, 2016.

I have discussed the findings and recommendations with the patient.
Results were also provided in writing at the conclusion of the
visit. If applicable, a reminder letter will be sent to the patient
regarding the next appointment.

BI-RADS CATEGORY  2: Benign.

## 2018-08-14 ENCOUNTER — Telehealth (HOSPITAL_COMMUNITY): Payer: Self-pay | Admitting: *Deleted

## 2018-08-14 NOTE — Telephone Encounter (Signed)
Telephoned patient at home number and left message to return call to [facility] 

## 2018-08-15 ENCOUNTER — Other Ambulatory Visit: Payer: Self-pay | Admitting: Obstetrics and Gynecology

## 2018-08-15 DIAGNOSIS — Z1231 Encounter for screening mammogram for malignant neoplasm of breast: Secondary | ICD-10-CM

## 2018-10-17 ENCOUNTER — Telehealth (HOSPITAL_COMMUNITY): Payer: Self-pay | Admitting: *Deleted

## 2018-10-17 NOTE — Telephone Encounter (Signed)
Telephoned patient, patient did not have voicemail .

## 2018-10-18 ENCOUNTER — Ambulatory Visit (HOSPITAL_COMMUNITY): Payer: No Typology Code available for payment source

## 2018-11-19 ENCOUNTER — Ambulatory Visit: Payer: Self-pay | Admitting: Nurse Practitioner
